# Patient Record
Sex: Male | Born: 1990 | Race: White | Hispanic: No | Marital: Married
Health system: Southern US, Community
[De-identification: ages and names within clinical notes are randomized; demographics above are authoritative.]

---

## 2018-10-17 ENCOUNTER — Telehealth: Payer: Self-pay | Admitting: *Deleted

## 2018-10-17 DIAGNOSIS — Z20822 Contact with and (suspected) exposure to covid-19: Secondary | ICD-10-CM

## 2018-10-17 NOTE — Telephone Encounter (Signed)
Referred by [facility] Department for COVID-19 testing.  Durene Cal DOB: 06/01/1990 Address: [address] [place] [address] Phone # [phone] Insurance: Rayetta Humphrey Works for [employer]    Left message with pt's wife to return call to schedule COVID-19 testing due to pt being at work currently.   Attempted to call pt at [phone] but no answer.Unable to leave a message on voicemail due to mailbox not being set up.

## 2020-03-19 DIAGNOSIS — S39012A Strain of muscle, fascia and tendon of lower back, initial encounter: Secondary | ICD-10-CM | POA: Diagnosis not present

## 2020-07-15 DIAGNOSIS — M79641 Pain in right hand: Secondary | ICD-10-CM | POA: Diagnosis not present

## 2020-07-15 DIAGNOSIS — S62308A Unspecified fracture of other metacarpal bone, initial encounter for closed fracture: Secondary | ICD-10-CM | POA: Diagnosis not present

## 2020-07-18 DIAGNOSIS — M79641 Pain in right hand: Secondary | ICD-10-CM | POA: Diagnosis not present

## 2020-08-02 DIAGNOSIS — M79641 Pain in right hand: Secondary | ICD-10-CM | POA: Diagnosis not present

## 2021-06-09 ENCOUNTER — Ambulatory Visit
Admission: RE | Admit: 2021-06-09 | Discharge: 2021-06-09 | Disposition: A | Discharge status: Home / Self Care | Payer: BC Managed Care – PPO | Source: Ambulatory Visit | Attending: Home Modifications | Admitting: Home Modifications

## 2021-06-09 ENCOUNTER — Other Ambulatory Visit: Payer: Self-pay | Admitting: Home Modifications

## 2021-06-09 DIAGNOSIS — M545 Low back pain, unspecified: Secondary | ICD-10-CM

## 2022-05-17 DIAGNOSIS — M5442 Lumbago with sciatica, left side: Secondary | ICD-10-CM | POA: Diagnosis not present

## 2022-06-14 DIAGNOSIS — M5442 Lumbago with sciatica, left side: Secondary | ICD-10-CM | POA: Diagnosis not present

## 2022-06-14 DIAGNOSIS — M79662 Pain in left lower leg: Secondary | ICD-10-CM | POA: Diagnosis not present

## 2022-06-22 DIAGNOSIS — M545 Low back pain, unspecified: Secondary | ICD-10-CM | POA: Diagnosis not present

## 2022-06-26 DIAGNOSIS — M5442 Lumbago with sciatica, left side: Secondary | ICD-10-CM | POA: Diagnosis not present

## 2022-07-03 DIAGNOSIS — M5416 Radiculopathy, lumbar region: Secondary | ICD-10-CM | POA: Diagnosis not present

## 2022-09-07 DIAGNOSIS — M5416 Radiculopathy, lumbar region: Secondary | ICD-10-CM | POA: Diagnosis not present

## 2022-09-11 DIAGNOSIS — M5117 Intervertebral disc disorders with radiculopathy, lumbosacral region: Secondary | ICD-10-CM | POA: Diagnosis not present

## 2022-09-11 DIAGNOSIS — M5417 Radiculopathy, lumbosacral region: Secondary | ICD-10-CM | POA: Diagnosis not present

## 2023-02-10 IMAGING — DX DG LUMBAR SPINE 2-3V
2 series · 2 of 2 positions shown · non-contrast
Comparison: None.

CLINICAL DATA: Bilateral low back pain and tightness for 3-4
months. Weakness in bilateral legs x 2 weeks.

EXAM:
LUMBAR SPINE - 2-3 VIEW

[view not recorded (1 of 2)]
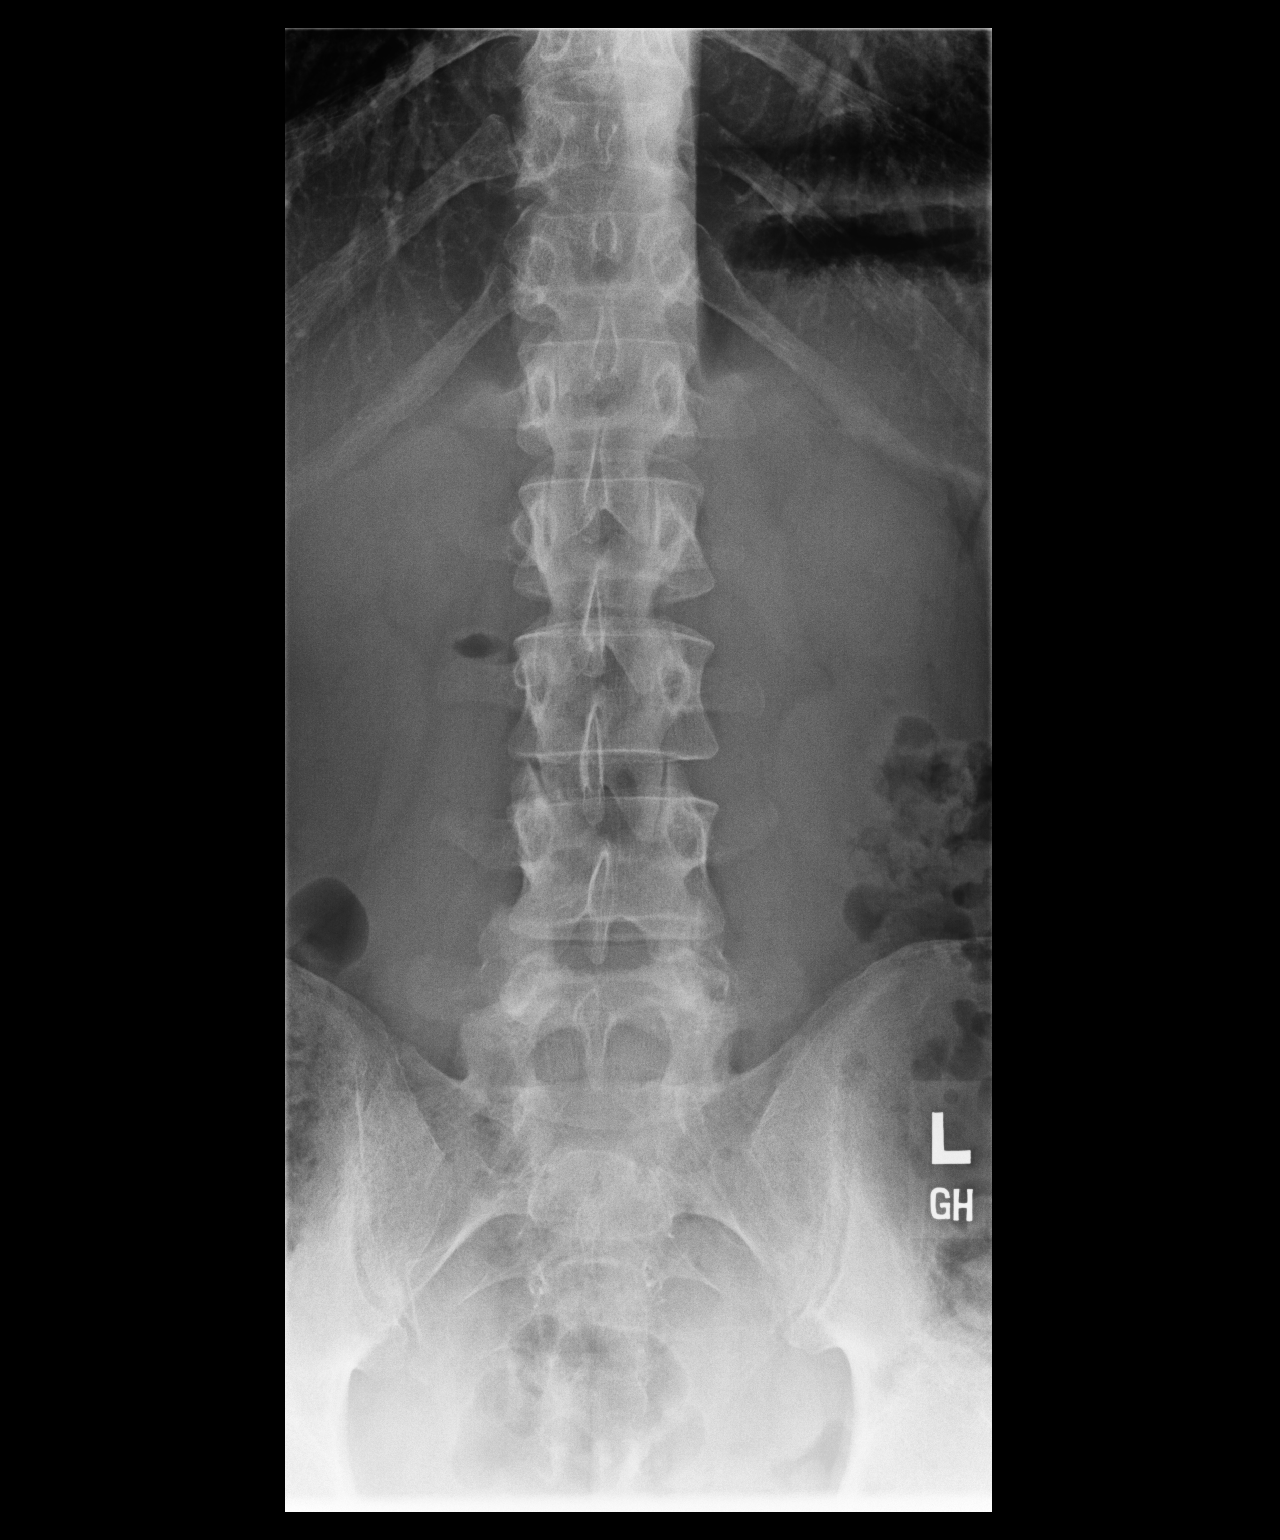

[view not recorded (2 of 2)]
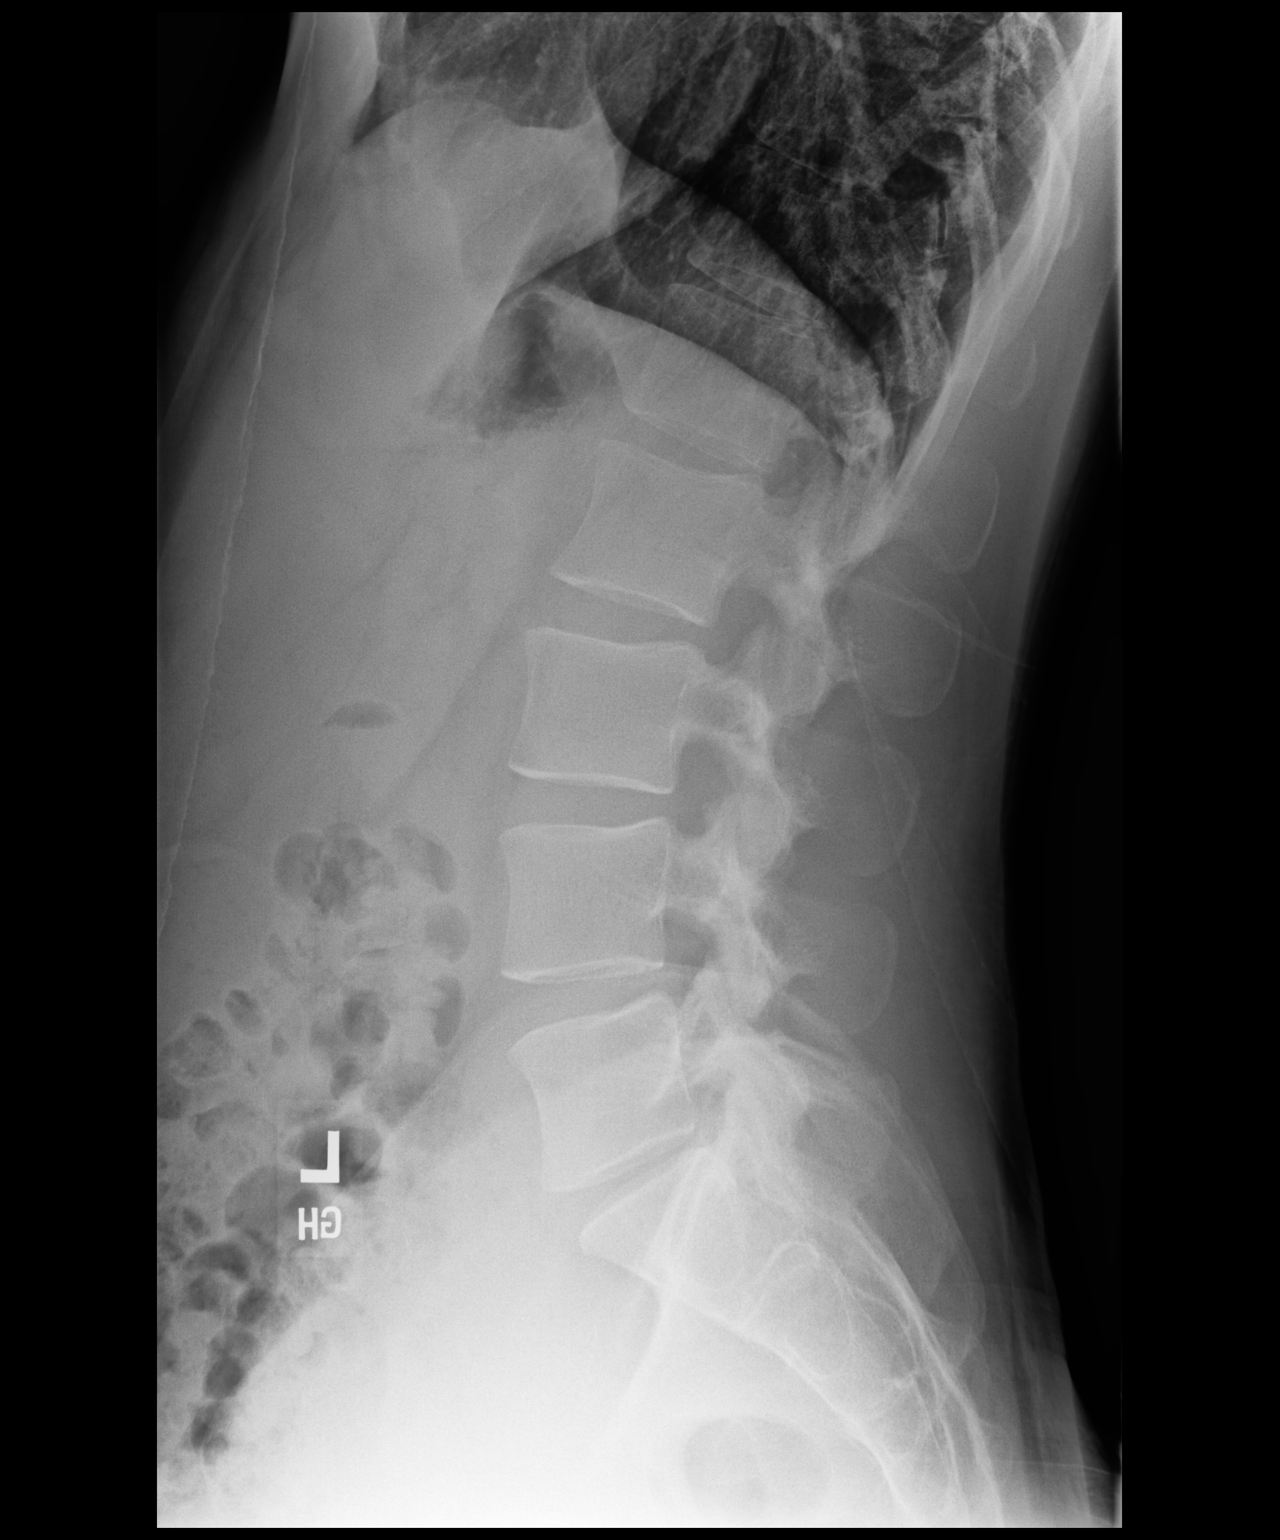

[2 of 2 positions shown; findings below may reference images not displayed]

FINDINGS: There is no evidence of lumbar spine fracture. Alignment is normal.
Intervertebral disc spaces are maintained.
IMPRESSION: Negative.

## 2023-09-06 DIAGNOSIS — H338 Other retinal detachments: Secondary | ICD-10-CM | POA: Diagnosis not present

## 2023-09-07 DIAGNOSIS — H35413 Lattice degeneration of retina, bilateral: Secondary | ICD-10-CM | POA: Diagnosis not present

## 2023-09-07 DIAGNOSIS — H33013 Retinal detachment with single break, bilateral: Secondary | ICD-10-CM | POA: Diagnosis not present

## 2023-09-07 DIAGNOSIS — H43823 Vitreomacular adhesion, bilateral: Secondary | ICD-10-CM | POA: Diagnosis not present

## 2023-09-10 DIAGNOSIS — H33022 Retinal detachment with multiple breaks, left eye: Secondary | ICD-10-CM | POA: Diagnosis not present

## 2023-09-17 DIAGNOSIS — H43821 Vitreomacular adhesion, right eye: Secondary | ICD-10-CM | POA: Diagnosis not present

## 2023-09-17 DIAGNOSIS — Z9889 Other specified postprocedural states: Secondary | ICD-10-CM | POA: Diagnosis not present

## 2023-10-08 DIAGNOSIS — H33021 Retinal detachment with multiple breaks, right eye: Secondary | ICD-10-CM | POA: Diagnosis not present

## 2023-10-16 DIAGNOSIS — Z9889 Other specified postprocedural states: Secondary | ICD-10-CM | POA: Diagnosis not present

## 2023-10-16 DIAGNOSIS — H43821 Vitreomacular adhesion, right eye: Secondary | ICD-10-CM | POA: Diagnosis not present

## 2023-11-06 DIAGNOSIS — Z9889 Other specified postprocedural states: Secondary | ICD-10-CM | POA: Diagnosis not present

## 2023-11-06 DIAGNOSIS — H31093 Other chorioretinal scars, bilateral: Secondary | ICD-10-CM | POA: Diagnosis not present

## 2023-11-06 DIAGNOSIS — H43821 Vitreomacular adhesion, right eye: Secondary | ICD-10-CM | POA: Diagnosis not present

## 2024-02-06 DIAGNOSIS — H43821 Vitreomacular adhesion, right eye: Secondary | ICD-10-CM | POA: Diagnosis not present

## 2024-02-06 DIAGNOSIS — H31093 Other chorioretinal scars, bilateral: Secondary | ICD-10-CM | POA: Diagnosis not present

## 2024-02-06 DIAGNOSIS — Z8669 Personal history of other diseases of the nervous system and sense organs: Secondary | ICD-10-CM | POA: Diagnosis not present
